# Patient Record
Sex: Male | Born: 1995 | Race: Black or African American | Hispanic: No | Marital: Single | Smoking: Never smoker
Health system: Southern US, Community
[De-identification: ages and names within clinical notes are randomized; demographics above are authoritative.]

---
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
## 2015-07-18 ENCOUNTER — Ambulatory Visit: Payer: BLUE CROSS/BLUE SHIELD | Admitting: Family Medicine

## 2015-09-06 ENCOUNTER — Ambulatory Visit (INDEPENDENT_AMBULATORY_CARE_PROVIDER_SITE_OTHER): Payer: BLUE CROSS/BLUE SHIELD | Admitting: Family Medicine

## 2015-09-06 ENCOUNTER — Encounter: Payer: Self-pay | Admitting: Family Medicine

## 2015-09-06 VITALS — BP 110/72 | HR 69 | Temp 99.0°F | Ht 68.5 in | Wt 164.0 lb

## 2015-09-06 DIAGNOSIS — R05 Cough: Secondary | ICD-10-CM | POA: Diagnosis not present

## 2015-09-06 DIAGNOSIS — R059 Cough, unspecified: Secondary | ICD-10-CM

## 2015-09-06 MED ORDER — FLUTICASONE PROPIONATE 50 MCG/ACT NA SUSP: 2.0000 | Freq: Every day | NASAL | 6 refills | Status: DC

## 2015-09-06 MED ORDER — TRIAMCINOLONE ACETONIDE 0.1 % EX CREA: 1.0000 "application " | TOPICAL_CREAM | Freq: Two times a day (BID) | CUTANEOUS | 0 refills | Status: DC

## 2015-09-06 NOTE — Patient Instructions (Addendum)

## 2015-09-06 NOTE — Progress Notes (Signed)
BP 110/72 (BP Location: Left Arm, Patient Position: Sitting, Cuff Size: Normal)   Pulse 69   Temp 99 F (37.2 C)   Ht 5' 8.5" (1.74 m)   Wt 164 lb (74.4 kg)   SpO2 98%   BMI 24.57 kg/m    Subjective:    Patient ID: Colton Simmons, male    DOB: 02/28/1995, 20 y.o.   MRN: [id number]  HPI: Colton Simmons is a 20 y.o. male  Chief Complaint  Patient presents with  . Cough   COUGH Duration: 3 months Circumstances of initial development of cough: unknown Cough severity: moderate Cough description: productive and hacking Aggravating factors:  worse in the AM and with exercise Alleviating factors: nothing Status:  stable Treatments attempted: cold/sinus, mucinex and cough syrup Wheezing: no Shortness of breath: no Chest pain: no Chest tightness:no Nasal congestion: yes Runny nose: no Postnasal drip: yes Frequent throat clearing or swallowing: yes Hemoptysis: no Fevers: no Night sweats: no Weight loss: no Heartburn: no Recent foreign travel: no Tuberculosis contacts: no  Relevant past medical, surgical, family and social history reviewed and updated as indicated. Interim medical history since our last visit reviewed. Allergies and medications reviewed and updated.  Review of Systems  Constitutional: Negative.   HENT: Positive for congestion, postnasal drip and rhinorrhea. Negative for dental problem, drooling, ear discharge, ear pain, facial swelling, hearing loss, mouth sores, nosebleeds, sinus pressure, sneezing, sore throat, trouble swallowing and voice change.   Respiratory: Positive for cough. Negative for apnea, choking, chest tightness, shortness of breath, wheezing and stridor.   Cardiovascular: Negative.   Psychiatric/Behavioral: Negative.     Per HPI unless specifically indicated above     Objective:    BP 110/72 (BP Location: Left Arm, Patient Position: Sitting, Cuff Size: Normal)   Pulse 69   Temp 99 F (37.2 C)   Ht 5' 8.5" (1.74 m)   Wt 164 lb  (74.4 kg)   SpO2 98%   BMI 24.57 kg/m   Wt Readings from Last 3 Encounters:  09/06/15 164 lb (74.4 kg)  10/19/14 166 lb (75.3 kg) (68 %, Z= 0.46)*  01/26/14 161 lb (73 kg) (65 %, Z= 0.38)*   * Growth percentiles are based on CDC 2-20 Years data.    Physical Exam  Constitutional: He is oriented to person, place, and time. He appears well-developed and well-nourished. No distress.  HENT:  Head: Normocephalic and atraumatic.  Right Ear: Hearing and external ear normal.  Left Ear: Hearing and external ear normal.  Nose: Nose normal.  Mouth/Throat: Oropharynx is clear and moist. No oropharyngeal exudate.  Significant post-nasal drip  Eyes: Conjunctivae, EOM and lids are normal. Pupils are equal, round, and reactive to light. Right eye exhibits no discharge. Left eye exhibits no discharge. No scleral icterus.  Neck: Normal range of motion. Neck supple. No JVD present. No tracheal deviation present. No thyromegaly present.  Cardiovascular: Normal rate, regular rhythm, normal heart sounds and intact distal pulses.  Exam reveals no gallop and no friction rub.   No murmur heard. Pulmonary/Chest: Effort normal and breath sounds normal. No stridor. No respiratory distress. He has no wheezes. He has no rales. He exhibits no tenderness.  Musculoskeletal: Normal range of motion.  Lymphadenopathy:    He has no cervical adenopathy.  Neurological: He is alert and oriented to person, place, and time.  Skin: Skin is warm, dry and intact. No rash noted. He is not diaphoretic. No erythema. No pallor.  Psychiatric: He has  a normal mood and affect. His speech is normal and behavior is normal. Judgment and thought content normal. Cognition and memory are normal.  Nursing note and vitals reviewed.   Results for orders placed or performed in visit on 11/03/14  CBC with Differential/Platelet  Result Value Ref Range   WBC 2.9 (L) 3.4 - 10.8 x10E3/uL   RBC 5.41 4.14 - 5.80 x10E6/uL   Hemoglobin 14.7 12.6 -  17.7 g/dL   Hematocrit 16.143.9 09.637.5 - 51.0 %   MCV 81 79 - 97 fL   MCH 27.2 26.6 - 33.0 pg   MCHC 33.5 31.5 - 35.7 g/dL   RDW 04.511.9 (L) 40.912.3 - 81.115.4 %   Platelets 215 150 - 379 x10E3/uL   Neutrophils 40 %   Lymphs 44 %   Monocytes 12 %   Eos 4 %   Basos 0 %   Neutrophils Absolute 1.2 (L) 1.4 - 7.0 x10E3/uL   Lymphocytes Absolute 1.2 0.7 - 3.1 x10E3/uL   Monocytes Absolute 0.3 0.1 - 0.9 x10E3/uL   EOS (ABSOLUTE) 0.1 0.0 - 0.4 x10E3/uL   Basophils Absolute 0.0 0.0 - 0.2 x10E3/uL   Immature Granulocytes 0 %   Immature Grans (Abs) 0.0 0.0 - 0.1 x10E3/uL      Assessment & Plan:   Problem List Items Addressed This Visit    None    Visit Diagnoses    Cough    -  Primary   Normal spiro. Appears to be due to post nasal drip. Start flonase and check back in in about a month for physical. Call with any concerns.    Relevant Orders   Spirometry with Graph (Completed)       Follow up plan: Return after 9/27 for physical.

## 2015-09-14 ENCOUNTER — Encounter (INDEPENDENT_AMBULATORY_CARE_PROVIDER_SITE_OTHER): Payer: Self-pay

## 2015-10-21 ENCOUNTER — Encounter: Payer: Self-pay | Admitting: Family Medicine

## 2015-10-21 ENCOUNTER — Ambulatory Visit (INDEPENDENT_AMBULATORY_CARE_PROVIDER_SITE_OTHER): Payer: BLUE CROSS/BLUE SHIELD | Admitting: Family Medicine

## 2015-10-21 VITALS — BP 135/79 | HR 61 | Temp 97.6°F | Ht 69.2 in | Wt 164.3 lb

## 2015-10-21 DIAGNOSIS — Z113 Encounter for screening for infections with a predominantly sexual mode of transmission: Secondary | ICD-10-CM | POA: Diagnosis not present

## 2015-10-21 DIAGNOSIS — F329 Major depressive disorder, single episode, unspecified: Secondary | ICD-10-CM

## 2015-10-21 DIAGNOSIS — F32A Depression, unspecified: Secondary | ICD-10-CM

## 2015-10-21 DIAGNOSIS — Z0000 Encounter for general adult medical examination without abnormal findings: Secondary | ICD-10-CM

## 2015-10-21 DIAGNOSIS — D72819 Decreased white blood cell count, unspecified: Secondary | ICD-10-CM

## 2015-10-21 LAB — UA/M W/RFLX CULTURE, ROUTINE
Bilirubin, UA: NEGATIVE
Glucose, UA: NEGATIVE
Ketones, UA: NEGATIVE
Leukocytes, UA: NEGATIVE
NITRITE UA: NEGATIVE
PH UA: 6 (ref 5.0–7.5)
RBC, UA: NEGATIVE
Specific Gravity, UA: >1.03 — ABNORMAL HIGH (ref 1.005–1.030)
Urobilinogen, Ur: 0.2 mg/dL (ref 0.2–1.0)

## 2015-10-21 LAB — MICROSCOPIC EXAMINATION

## 2015-10-21 NOTE — Progress Notes (Signed)
BP 135/79 (BP Location: Left Arm, Patient Position: Sitting, Cuff Size: Normal)   Pulse 61   Temp 97.6 F (36.4 C)   Ht 5' 9.2" (1.758 m)   Wt 164 lb 4.8 oz (74.5 kg)   SpO2 98%   BMI 24.12 kg/m    Subjective:    Patient ID: MARKEVION LATTIN, male    DOB: 09/08/1995, 20 y.o.   MRN: [id number]  HPI: THANIEL COLUCCIO is a 20 y.o. male presenting on 10/21/2015 for comprehensive medical examination. Current medical complaints include: Loss of appetite  He currently lives with: parents Interim Problems from his last visit: no  Depression Screen done today and results listed below:  Depression screen [facility] 2/9 10/21/2015  Decreased Interest 1  Down, Depressed, Hopeless 2  PHQ - 2 Score 3  Altered sleeping 2  Tired, decreased energy 3  Change in appetite 1  Feeling bad or failure about yourself  3  Trouble concentrating 1  Moving slowly or fidgety/restless 1  Suicidal thoughts 1  PHQ-9 Score 15   GAD 7 : Generalized Anxiety Score 10/21/2015  Nervous, Anxious, on Edge 2  Control/stop worrying 3  Worry too much - different things 3  Trouble relaxing 1  Restless 1  Easily annoyed or irritable 3  Afraid - awful might happen 1  Total GAD 7 Score 14  Anxiety Difficulty Somewhat difficult    Past Medical History:  History reviewed. No pertinent past medical history.  Surgical History:  History reviewed. No pertinent surgical history.  Medications:  No current outpatient prescriptions on file prior to visit.   No current facility-administered medications on file prior to visit.     Allergies:  No Known Allergies  Social History:  Social History   Social History  . Marital status: Single    Spouse name: N/A  . Number of children: N/A  . Years of education: N/A   Occupational History  . Not on file.   Social History Main Topics  . Smoking status: Never Smoker  . Smokeless tobacco: Never Used  . Alcohol use No  . Drug use: No  . Sexual activity: No   Other Topics  Concern  . Not on file   Social History Narrative  . No narrative on file   History  Smoking Status  . Never Smoker  Smokeless Tobacco  . Never Used   History  Alcohol Use No    Family History:  Family History  Problem Relation Age of Onset  . Menstrual problems Sister     Past medical history, surgical history, medications, allergies, family history and social history reviewed with patient today and changes made to appropriate areas of the chart.   Review of Systems  Constitutional: Negative.   HENT: Negative.   Eyes: Positive for blurred vision (at night). Negative for double vision, photophobia, pain, discharge and redness.  Respiratory: Negative.   Cardiovascular: Negative.   Gastrointestinal: Negative.        Loss of appetite- last few weeks  Genitourinary: Negative.   Musculoskeletal: Negative.   Skin: Negative.   Neurological: Negative.   Endo/Heme/Allergies: Negative.   Psychiatric/Behavioral: Positive for depression. Negative for hallucinations, memory loss, substance abuse and suicidal ideas. The patient is nervous/anxious. The patient does not have insomnia.     All other ROS negative except what is listed above and in the HPI.      Objective:    BP 135/79 (BP Location: Left Arm, Patient Position: Sitting, Cuff Size:  Normal)   Pulse 61   Temp 97.6 F (36.4 C)   Ht 5' 9.2" (1.758 m)   Wt 164 lb 4.8 oz (74.5 kg)   SpO2 98%   BMI 24.12 kg/m   Wt Readings from Last 3 Encounters:  10/21/15 164 lb 4.8 oz (74.5 kg)  09/06/15 164 lb (74.4 kg)  10/19/14 166 lb (75.3 kg) (68 %, Z= 0.46)*   * Growth percentiles are based on CDC 2-20 Years data.    Physical Exam  Constitutional: He is oriented to person, place, and time. He appears well-developed and well-nourished. No distress.  HENT:  Head: Normocephalic and atraumatic.  Right Ear: Hearing, tympanic membrane, external ear and ear canal normal.  Left Ear: Hearing, tympanic membrane, external ear and  ear canal normal.  Nose: Nose normal.  Mouth/Throat: Uvula is midline, oropharynx is clear and moist and mucous membranes are normal. No oropharyngeal exudate.  Eyes: Conjunctivae, EOM and lids are normal. Pupils are equal, round, and reactive to light. Right eye exhibits no discharge. Left eye exhibits no discharge. No scleral icterus.  Neck: Normal range of motion. Neck supple. No JVD present. No tracheal deviation present. No thyromegaly present.  Cardiovascular: Normal rate, regular rhythm, normal heart sounds and intact distal pulses.  Exam reveals no gallop and no friction rub.   No murmur heard. Pulmonary/Chest: Effort normal and breath sounds normal. No stridor. No respiratory distress. He has no wheezes. He has no rales. He exhibits no tenderness.  Abdominal: Soft. Bowel sounds are normal. He exhibits no distension and no mass. There is no tenderness. There is no rebound and no guarding.  Genitourinary: Penis normal. No penile tenderness.  Musculoskeletal: Normal range of motion. He exhibits no edema, tenderness or deformity.  Lymphadenopathy:    He has no cervical adenopathy.  Neurological: He is alert and oriented to person, place, and time. He has normal reflexes. He displays normal reflexes. No cranial nerve deficit. He exhibits normal muscle tone. Coordination normal.  Skin: Skin is warm, dry and intact. No rash noted. He is not diaphoretic. No erythema. No pallor.  Psychiatric: His speech is normal and behavior is normal. Judgment and thought content normal. His mood appears anxious. Cognition and memory are normal. He exhibits a depressed mood.  Nursing note and vitals reviewed.   Results for orders placed or performed in visit on 11/03/14  CBC with Differential/Platelet  Result Value Ref Range   WBC 2.9 (L) 3.4 - 10.8 x10E3/uL   RBC 5.41 4.14 - 5.80 x10E6/uL   Hemoglobin 14.7 12.6 - 17.7 g/dL   Hematocrit 16.143.9 09.637.5 - 51.0 %   MCV 81 79 - 97 fL   MCH 27.2 26.6 - 33.0 pg    MCHC 33.5 31.5 - 35.7 g/dL   RDW 04.511.9 (L) 40.912.3 - 81.115.4 %   Platelets 215 150 - 379 x10E3/uL   Neutrophils 40 %   Lymphs 44 %   Monocytes 12 %   Eos 4 %   Basos 0 %   Neutrophils Absolute 1.2 (L) 1.4 - 7.0 x10E3/uL   Lymphocytes Absolute 1.2 0.7 - 3.1 x10E3/uL   Monocytes Absolute 0.3 0.1 - 0.9 x10E3/uL   EOS (ABSOLUTE) 0.1 0.0 - 0.4 x10E3/uL   Basophils Absolute 0.0 0.0 - 0.2 x10E3/uL   Immature Granulocytes 0 %   Immature Grans (Abs) 0.0 0.0 - 0.1 x10E3/uL      Assessment & Plan:   Problem List Items Addressed This Visit      Other  Leukopenia    Rechecking levels today.        Other Visit Diagnoses    Routine general medical examination at a health care facility    -  Primary   Up to date on vaccines. Screening labs checked today. Continue diet and exercise.    Relevant Orders   CBC with Differential/Platelet   Comprehensive metabolic panel   Lipid Panel w/o Chol/HDL Ratio   TSH   UA/M w/rflx Culture, Routine   Routine screening for STI (sexually transmitted infection)       Labs checked today, await results.    Relevant Orders   HIV antibody   Hepatitis, Acute   RPR   GC/Chlamydia Probe Amp   HSV(herpes simplex vrs) 1+2 ab-IgG   Immunization due       Flu shot given today.   Relevant Orders   Flu Vaccine QUAD 36+ mos PF IM (Fluarix & Fluzone Quad PF)   Depression       Would like to monitor. Does not want medicine at this time. Recheck 2 months to see how he's doing.        LABORATORY TESTING:  Health maintenance labs ordered today as discussed above.   IMMUNIZATIONS:   - Tdap: Tetanus vaccination status reviewed: last tetanus booster within 10 years. - Influenza: Administered today - Pneumovax: Not applicable   PATIENT COUNSELING:    Sexuality: Discussed sexually transmitted diseases, partner selection, use of condoms, avoidance of unintended pregnancy  and contraceptive alternatives.   Advised to avoid cigarette smoking.  I discussed with the  patient that most people either abstain from alcohol or drink within safe limits (<=14/week and <=4 drinks/occasion for males, <=7/weeks and <= 3 drinks/occasion for females) and that the risk for alcohol disorders and other health effects rises proportionally with the number of drinks per week and how often a drinker exceeds daily limits.  Discussed cessation/primary prevention of drug use and availability of treatment for abuse.   Diet: Encouraged to adjust caloric intake to maintain  or achieve ideal body weight, to reduce intake of dietary saturated fat and total fat, to limit sodium intake by avoiding high sodium foods and not adding table salt, and to maintain adequate dietary potassium and calcium preferably from fresh fruits, vegetables, and low-fat dairy products.    stressed the importance of regular exercise  Injury prevention: Discussed safety belts, safety helmets, smoke detector, smoking near bedding or upholstery.   Dental health: Discussed importance of regular tooth brushing, flossing, and dental visits.   Follow up plan: NEXT PREVENTATIVE PHYSICAL DUE IN 1 YEAR. No Follow-up on file.

## 2015-10-21 NOTE — Assessment & Plan Note (Signed)
Rechecking levels today. 

## 2015-10-22 LAB — LIPID PANEL W/O CHOL/HDL RATIO
Cholesterol, Total: 138 mg/dL (ref 100–199)
HDL: 47 mg/dL (ref >39)
LDL CALC: 74 mg/dL (ref 0–99)
Triglycerides: 83 mg/dL (ref 0–149)
VLDL CHOLESTEROL CAL: 17 mg/dL (ref 5–40)

## 2015-10-22 LAB — COMPREHENSIVE METABOLIC PANEL
A/G RATIO: 1.9 (ref 1.2–2.2)
ALT: 14 IU/L (ref 0–44)
AST: 14 IU/L (ref 0–40)
Albumin: 4.6 g/dL (ref 3.5–5.5)
Alkaline Phosphatase: 86 IU/L (ref 39–117)
BUN/Creatinine Ratio: 8 — ABNORMAL LOW (ref 9–20)
BUN: 8 mg/dL (ref 6–20)
Bilirubin Total: 1.7 mg/dL — ABNORMAL HIGH (ref 0.0–1.2)
CALCIUM: 9.3 mg/dL (ref 8.7–10.2)
CO2: 24 mmol/L (ref 18–29)
Chloride: 102 mmol/L (ref 96–106)
Creatinine, Ser: 0.97 mg/dL (ref 0.76–1.27)
GFR, EST AFRICAN AMERICAN: 129 mL/min/{1.73_m2} (ref >59)
GFR, EST NON AFRICAN AMERICAN: 112 mL/min/{1.73_m2} (ref >59)
Globulin, Total: 2.4 g/dL (ref 1.5–4.5)
Glucose: 98 mg/dL (ref 65–99)
POTASSIUM: 3.9 mmol/L (ref 3.5–5.2)
Sodium: 142 mmol/L (ref 134–144)
TOTAL PROTEIN: 7 g/dL (ref 6.0–8.5)

## 2015-10-22 LAB — HIV ANTIBODY (ROUTINE TESTING W REFLEX): HIV SCREEN 4TH GENERATION: NONREACTIVE

## 2015-10-22 LAB — CBC WITH DIFFERENTIAL/PLATELET
BASOS ABS: 0 10*3/uL (ref 0.0–0.2)
BASOS: 0 %
EOS (ABSOLUTE): 0.1 10*3/uL (ref 0.0–0.4)
Eos: 2 %
HEMATOCRIT: 43.8 % (ref 37.5–51.0)
Hemoglobin: 14.6 g/dL (ref 12.6–17.7)
IMMATURE GRANS (ABS): 0 10*3/uL (ref 0.0–0.1)
Immature Granulocytes: 0 %
LYMPHS ABS: 1.5 10*3/uL (ref 0.7–3.1)
Lymphs: 46 %
MCH: 26.9 pg (ref 26.6–33.0)
MCHC: 33.3 g/dL (ref 31.5–35.7)
MCV: 81 fL (ref 79–97)
MONOS ABS: 0.4 10*3/uL (ref 0.1–0.9)
Monocytes: 12 %
Neutrophils Absolute: 1.3 10*3/uL — ABNORMAL LOW (ref 1.4–7.0)
Neutrophils: 40 %
PLATELETS: 204 10*3/uL (ref 150–379)
RBC: 5.42 x10E6/uL (ref 4.14–5.80)
RDW: 12 % — ABNORMAL LOW (ref 12.3–15.4)
WBC: 3.3 10*3/uL — ABNORMAL LOW (ref 3.4–10.8)

## 2015-10-22 LAB — HEPATITIS PANEL, ACUTE
HEP B C IGM: NEGATIVE
Hep A IgM: NEGATIVE
Hep C Virus Ab: <0.1 s/co ratio (ref 0.0–0.9)
Hepatitis B Surface Ag: NEGATIVE

## 2015-10-22 LAB — RPR: RPR Ser Ql: NONREACTIVE

## 2015-10-22 LAB — HSV(HERPES SIMPLEX VRS) I + II AB-IGG: HSV 2 Glycoprotein G Ab, IgG: <0.91 index (ref 0.00–0.90)

## 2015-10-22 LAB — TSH: TSH: 0.976 u[IU]/mL (ref 0.450–4.500)

## 2015-10-24 ENCOUNTER — Telehealth: Payer: Self-pay | Admitting: Family Medicine

## 2015-10-24 LAB — GC/CHLAMYDIA PROBE AMP
Chlamydia trachomatis, NAA: NEGATIVE
NEISSERIA GONORRHOEAE BY PCR: NEGATIVE

## 2015-10-24 NOTE — Telephone Encounter (Signed)
Please let Onalee HuaDavid know that all his labs came back negative. Thanks!

## 2015-10-24 NOTE — Telephone Encounter (Signed)
Patient notified

## 2016-07-04 ENCOUNTER — Ambulatory Visit (INDEPENDENT_AMBULATORY_CARE_PROVIDER_SITE_OTHER): Payer: BLUE CROSS/BLUE SHIELD | Admitting: Family Medicine

## 2016-07-04 ENCOUNTER — Encounter: Payer: Self-pay | Admitting: Family Medicine

## 2016-07-04 VITALS — BP 123/76 | HR 64 | Temp 99.0°F | Ht 71.0 in | Wt 159.0 lb

## 2016-07-04 DIAGNOSIS — R042 Hemoptysis: Secondary | ICD-10-CM | POA: Diagnosis not present

## 2016-07-04 NOTE — Progress Notes (Signed)
   BP 123/76   Pulse 64   Temp 99 F (37.2 C)   Ht 5\' 11"  (1.803 m)   Wt 159 lb (72.1 kg)   SpO2 100%   BMI 22.18 kg/m    Subjective:    Patient ID: Colton KaufmannDavid J Simmons, male    DOB: 09/18/1995, 21 y.o.   MRN: [id number]  HPI: Colton KaufmannDavid J Simmons is a 21 y.o. male  Chief Complaint  Patient presents with  . Coughing Up Blood    happened earlier this week,  was spitting up bright red blood.  has now resolved. No other symptoms, no cough, no abdominal pain, no chest pain, no NVD.    Patient presents with an episode of hemoptysis toward the end of a 2 week URI. States it was a small amount of bright red blood on sputum several times after coughing fits. Has not happened since several days ago, and URI sxs have completely resolved. No fever, chills, SOB, wheezing, CP. Pt is a non-smoker, takes no medications, and no known medical issues.   Relevant past medical, surgical, family and social history reviewed and updated as indicated. Interim medical history since our last visit reviewed. Allergies and medications reviewed and updated.  Review of Systems  Constitutional: Negative.   HENT: Positive for congestion.   Eyes: Negative.   Respiratory: Positive for cough.        Hemoptysis  Cardiovascular: Negative.   Gastrointestinal: Negative.   Genitourinary: Negative.   Musculoskeletal: Negative.   Neurological: Negative.   Psychiatric/Behavioral: Negative.     Per HPI unless specifically indicated above     Objective:    BP 123/76   Pulse 64   Temp 99 F (37.2 C)   Ht 5\' 11"  (1.803 m)   Wt 159 lb (72.1 kg)   SpO2 100%   BMI 22.18 kg/m   Wt Readings from Last 3 Encounters:  07/04/16 159 lb (72.1 kg)  10/21/15 164 lb 4.8 oz (74.5 kg)  09/06/15 164 lb (74.4 kg)    Physical Exam  Constitutional: He is oriented to person, place, and time. He appears well-developed and well-nourished. No distress.  HENT:  Head: Atraumatic.  Right Ear: External ear normal.  Left Ear: External ear  normal.  Nose: Nose normal.  Mouth/Throat: Oropharynx is clear and moist. No oropharyngeal exudate.  Eyes: Conjunctivae are normal. Pupils are equal, round, and reactive to light. No scleral icterus.  Neck: Normal range of motion. Neck supple.  Cardiovascular: Normal rate and normal heart sounds.   Pulmonary/Chest: Effort normal and breath sounds normal. No respiratory distress.  Musculoskeletal: Normal range of motion.  Lymphadenopathy:    He has no cervical adenopathy.  Neurological: He is alert and oriented to person, place, and time.  Skin: Skin is warm and dry.  Psychiatric: He has a normal mood and affect. His behavior is normal.  Nursing note and vitals reviewed.     Assessment & Plan:   Problem List Items Addressed This Visit    None    Visit Diagnoses    Hemoptysis    -  Primary   Suspect d/t bronchial irritation from URI and persistent coughing. Check CBC, continue to monitor. URI has since resolved and no further bleeding episodes.    Relevant Orders   CBC with Differential/Platelet       Follow up plan: Return if symptoms worsen or fail to improve.

## 2016-07-05 ENCOUNTER — Telehealth: Payer: Self-pay | Admitting: Family Medicine

## 2016-07-05 DIAGNOSIS — D72819 Decreased white blood cell count, unspecified: Secondary | ICD-10-CM

## 2016-07-05 LAB — CBC WITH DIFFERENTIAL/PLATELET
BASOS ABS: 0 10*3/uL (ref 0.0–0.2)
Basos: 0 %
EOS (ABSOLUTE): 0 10*3/uL (ref 0.0–0.4)
Eos: 1 %
HEMOGLOBIN: 14.2 g/dL (ref 13.0–17.7)
Hematocrit: 43.6 % (ref 37.5–51.0)
IMMATURE GRANS (ABS): 0 10*3/uL (ref 0.0–0.1)
Immature Granulocytes: 0 %
LYMPHS: 41 %
Lymphocytes Absolute: 1.3 10*3/uL (ref 0.7–3.1)
MCH: 27.2 pg (ref 26.6–33.0)
MCHC: 32.6 g/dL (ref 31.5–35.7)
MCV: 84 fL (ref 79–97)
MONOCYTES: 13 %
Monocytes Absolute: 0.4 10*3/uL (ref 0.1–0.9)
NEUTROS ABS: 1.5 10*3/uL (ref 1.4–7.0)
Neutrophils: 45 %
Platelets: 257 10*3/uL (ref 150–379)
RBC: 5.22 x10E6/uL (ref 4.14–5.80)
RDW: 12.7 % (ref 12.3–15.4)
WBC: 3.3 10*3/uL — ABNORMAL LOW (ref 3.4–10.8)

## 2016-07-05 NOTE — Telephone Encounter (Signed)
Left message to call.

## 2016-07-05 NOTE — Telephone Encounter (Signed)
Please call and let him know his blood counts didn't indicate any bleeding issues that would have made him cough up blood - everything looked good other than his white blood cells are still remaining just a touch low so we are going to get him over to Hematology for further workup to see what's going on. Referral placed.

## 2016-07-06 NOTE — Telephone Encounter (Signed)
Patient notified of results and was given the appt for Hematology.

## 2016-07-06 NOTE — Telephone Encounter (Signed)
Patient returned Amy's call.  He can be reached at [phone]  Thanks

## 2016-07-24 ENCOUNTER — Inpatient Hospital Stay: Payer: BLUE CROSS/BLUE SHIELD

## 2016-07-24 ENCOUNTER — Inpatient Hospital Stay: Payer: BLUE CROSS/BLUE SHIELD | Attending: Oncology | Admitting: Oncology

## 2016-07-24 ENCOUNTER — Encounter: Payer: Self-pay | Admitting: Oncology

## 2016-07-24 VITALS — BP 116/67 | HR 64 | Temp 97.0°F | Resp 18 | Ht 70.0 in | Wt 148.5 lb

## 2016-07-24 DIAGNOSIS — D709 Neutropenia, unspecified: Secondary | ICD-10-CM | POA: Diagnosis present

## 2016-07-24 DIAGNOSIS — Z809 Family history of malignant neoplasm, unspecified: Secondary | ICD-10-CM

## 2016-07-24 LAB — CBC WITH DIFFERENTIAL/PLATELET
Basophils Absolute: 0.1 10*3/uL (ref 0–0.1)
Basophils Relative: 4 %
Eosinophils Absolute: 0 10*3/uL (ref 0–0.7)
Eosinophils Relative: 1 %
HEMATOCRIT: 45.1 % (ref 40.0–52.0)
HEMOGLOBIN: 15 g/dL (ref 13.0–18.0)
LYMPHS ABS: 1.3 10*3/uL (ref 1.0–3.6)
LYMPHS PCT: 40 %
MCH: 27.3 pg (ref 26.0–34.0)
MCHC: 33.2 g/dL (ref 32.0–36.0)
MCV: 82.2 fL (ref 80.0–100.0)
MONOS PCT: 8 %
Monocytes Absolute: 0.3 10*3/uL (ref 0.2–1.0)
NEUTROS ABS: 1.5 10*3/uL (ref 1.4–6.5)
NEUTROS PCT: 47 %
Platelets: 204 10*3/uL (ref 150–440)
RBC: 5.49 MIL/uL (ref 4.40–5.90)
RDW: 12.6 % (ref 11.5–14.5)
WBC: 3.2 10*3/uL — AB (ref 3.8–10.6)

## 2016-07-24 LAB — FOLATE: FOLATE: 29 ng/mL (ref >5.9)

## 2016-07-24 LAB — VITAMIN B12: VITAMIN B 12: 746 pg/mL (ref 180–914)

## 2016-07-24 NOTE — Progress Notes (Signed)
Patient here today as new evaluation regarding leukopenia.  Referred by Dr. Maurice MarchLane.

## 2016-07-25 ENCOUNTER — Encounter: Payer: Self-pay | Admitting: Oncology

## 2016-07-25 NOTE — Progress Notes (Signed)
Hematology/Oncology Consult note [facility] Telephone:([phone] [phone]) [phone]  Patient Care Team: Valerie Roys, DO as PCP - General (Family Medicine)   Name of the patient: Colton Simmons  [id number]  11-Sep-1995    Reason for referral- leukopenia   Referring physician- Dr. Park Liter  Date of visit: 07/25/16   History of presenting illness- Patient is a 21 yr old african [place] male who has been referred for leukopenia/ neutropenia. Most recent cbc from 07/04/16 showed wbc of 3.3, H/H of 14./43.6 and platelet count of 257. difefrential showed mild neutropenia with anc of 1.5. Reviewing his prior cbc, patient has had waxing and waning leukopenia/ neutropenia and his anc is between 1.1-1.5. No family h/o blood disorders. He has not had any recurrent infections growing up other than occasional ear infections. He feels well and denies any fever, unintentional weight loss or night sweats. Denies any use of medications, OTC meds or herbal medications. Recent HIV, Hepatitis testing negative  ECOG PS- 0  Pain scale- 0   Review of systems- Review of Systems  Constitutional: Negative for chills, fever, malaise/fatigue and weight loss.  HENT: Negative for congestion, ear discharge and nosebleeds.   Eyes: Negative for blurred vision.  Respiratory: Negative for cough, hemoptysis, sputum production, shortness of breath and wheezing.   Cardiovascular: Negative for chest pain, palpitations, orthopnea and claudication.  Gastrointestinal: Negative for abdominal pain, blood in stool, constipation, diarrhea, heartburn, melena, nausea and vomiting.  Genitourinary: Negative for dysuria, flank pain, frequency, hematuria and urgency.  Musculoskeletal: Negative for back pain, joint pain and myalgias.  Skin: Negative for rash.  Neurological: Negative for dizziness, tingling, focal weakness, seizures, weakness and headaches.  Endo/Heme/Allergies: Does not bruise/bleed  easily.  Psychiatric/Behavioral: Negative for depression and suicidal ideas. The patient does not have insomnia.     No Known Allergies  Patient Active Problem List   Diagnosis Date Noted  . Leukopenia 11/05/2014     No past medical history on file.   Past Surgical History:  Procedure Laterality Date  . PROSTATE SURGERY  2014    Social History   Social History  . Marital status: Single    Spouse name: N/A  . Number of children: N/A  . Years of education: N/A   Occupational History  . Not on file.   Social History Main Topics  . Smoking status: Never Smoker  . Smokeless tobacco: Never Used  . Alcohol use Yes     Comment: socially  . Drug use: No  . Sexual activity: No   Other Topics Concern  . Not on file   Social History Narrative  . No narrative on file     Family History  Problem Relation Age of Onset  . Cancer Father   . Menstrual problems Sister   . Cancer Paternal Aunt     No current outpatient prescriptions on file.   Physical exam:  Vitals:   07/24/16 1520  BP: 116/67  Pulse: 64  Resp: 18  Temp: (!) 97 F (36.1 C)  TempSrc: Tympanic  Weight: 148 lb 8 oz (67.4 kg)  Height: _0  (1.778 m)   Physical Exam  Constitutional: He is oriented to person, place, and time and well-developed, well-nourished, and in no distress.  HENT:  Head: Normocephalic and atraumatic.  Eyes: EOM are normal. Pupils are equal, round, and reactive to light.  Neck: Normal range of motion.  Cardiovascular: Normal rate, regular rhythm and normal heart sounds.   Pulmonary/Chest: Effort normal  and breath sounds normal.  Abdominal: Soft. Bowel sounds are normal.  No palpable splenomegaly  Lymphadenopathy:  No palpable cervical, supraclavicular, axillary or inguinal  Neurological: He is alert and oriented to person, place, and time.  Skin: Skin is warm and dry.       CMP Latest Ref Rng & Units 10/21/2015  Glucose 65 - 99 mg/dL 98  BUN 6 - 20 mg/dL 8    Creatinine 0.76 - 1.27 mg/dL 0.97  Sodium 134 - 144 mmol/L 142  Potassium 3.5 - 5.2 mmol/L 3.9  Chloride 96 - 106 mmol/L 102  CO2 18 - 29 mmol/L 24  Calcium 8.7 - 10.2 mg/dL 9.3  Total Protein 6.0 - 8.5 g/dL 7.0  Total Bilirubin 0.0 - 1.2 mg/dL 1.7(H)  Alkaline Phos 39 - 117 IU/L 86  AST 0 - 40 IU/L 14  ALT 0 - 44 IU/L 14   CBC Latest Ref Rng & Units 07/24/2016  WBC 3.8 - 10.6 K/uL 3.2(L)  Hemoglobin 13.0 - 18.0 g/dL 15.0  Hematocrit 40.0 - 52.0 % 45.1  Platelets 150 - 440 K/uL 204   Assessment and plan- Patient is a 21 y.o. male referred for leukopenia/ neutropenia  Patient has had long standing leukopenia/neutropenia atleast dating back to 2016. He has isolated neutropenia in the absence of other cytopenias. No evidence of adenopathy, B symptoms or recurrent infections. Recent HIV and hepatitis testing negative. 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 I will check repeat cbc with diff, pathology review of smear, B12 and folate. I will see him back in 2 weeks time to discuss the results and further management   Thank you for this kind referral and the opportunity to participate in the care of this patient   Visit Diagnosis 1. Leukopenia, unspecified type     Dr. Randa Evens, MD, MPH [facility] Pager- [phone] 07/25/2016  6:54 PM

## 2016-07-26 LAB — PATHOLOGIST SMEAR REVIEW

## 2016-08-09 ENCOUNTER — Inpatient Hospital Stay: Payer: BLUE CROSS/BLUE SHIELD | Admitting: Oncology

## 2016-08-23 ENCOUNTER — Telehealth: Payer: Self-pay | Admitting: *Deleted

## 2016-08-23 ENCOUNTER — Inpatient Hospital Stay: Payer: BLUE CROSS/BLUE SHIELD | Admitting: Oncology

## 2016-08-23 NOTE — Telephone Encounter (Signed)
Called pt number that he left and got his voicemail and left message with his lab values and everything was normal except the wbc  At 3.2, explained that dr. Smith Robertao felt this was abnormal due to his ethnicity.  I asked him that if he had questions he could call me. Left my direct number and we were sending his an appt in 6 months from now.

## 2016-08-23 NOTE — Telephone Encounter (Signed)
-----  Message from Sindy Guadeloupe, MD sent at 08/23/2016 11:26 AM EDT ----- Regarding: FW: results Contact: [phone] Can you please call him and tell him cbc, b12, folate results. Mild neutropenia likely due to ethnicity. No need for bone marrow biopsy. I will see him back in 6 months with cbc with diff  Verdis Frederickson- can you schedule appointment?  Thanks, Astrid Divine ----- Message ----- From: Elouise Munroe Sent: 08/23/2016  11:09 AM To: Luella Cook, RN, Manus Rudd, RN, # Subject: results                                        PT couldn't make appt today @ 11-he requested results of Lab via phone?  THX

## 2016-10-22 ENCOUNTER — Ambulatory Visit (INDEPENDENT_AMBULATORY_CARE_PROVIDER_SITE_OTHER): Payer: BLUE CROSS/BLUE SHIELD | Admitting: Family Medicine

## 2016-10-22 ENCOUNTER — Encounter: Payer: Self-pay | Admitting: Family Medicine

## 2016-10-22 VITALS — BP 118/74 | HR 53 | Temp 97.2°F | Ht 69.4 in | Wt 156.2 lb

## 2016-10-22 DIAGNOSIS — Z113 Encounter for screening for infections with a predominantly sexual mode of transmission: Secondary | ICD-10-CM | POA: Diagnosis not present

## 2016-10-22 DIAGNOSIS — G4700 Insomnia, unspecified: Secondary | ICD-10-CM | POA: Diagnosis not present

## 2016-10-22 DIAGNOSIS — F329 Major depressive disorder, single episode, unspecified: Secondary | ICD-10-CM

## 2016-10-22 DIAGNOSIS — Z0000 Encounter for general adult medical examination without abnormal findings: Secondary | ICD-10-CM

## 2016-10-22 LAB — MICROSCOPIC EXAMINATION: Bacteria, UA: NONE SEEN

## 2016-10-22 LAB — UA/M W/RFLX CULTURE, ROUTINE
BILIRUBIN UA: NEGATIVE
GLUCOSE, UA: NEGATIVE
Ketones, UA: NEGATIVE
LEUKOCYTES UA: NEGATIVE
NITRITE UA: NEGATIVE
Protein, UA: NEGATIVE
RBC, UA: NEGATIVE
SPEC GRAV UA: 1.025 (ref 1.005–1.030)
Urobilinogen, Ur: 0.2 mg/dL (ref 0.2–1.0)
pH, UA: 6 (ref 5.0–7.5)

## 2016-10-22 MED ORDER — TRAZODONE HCL 50 MG PO TABS: 25.0000 mg | ORAL_TABLET | Freq: Every evening | ORAL | 3 refills | Status: DC | PRN

## 2016-10-22 NOTE — Assessment & Plan Note (Signed)
Will start trazodone and recheck 1-2 months. Call with any concerns.

## 2016-10-22 NOTE — Patient Instructions (Addendum)
Preventive Care for [place], Male The transition to life after high school as a young adult can be a stressful time with many changes. You may start seeing a primary care physician instead of a pediatrician. This is the time when your health care becomes your responsibility. Preventive care refers to lifestyle choices and visits with your health care provider that can promote health and wellness. What does preventive care include?  A yearly physical exam. This is also called an annual wellness visit.  Dental exams once or twice a year.  Routine eye exams. Ask your health care provider how often you should have your eyes checked.  Personal lifestyle choices, including: ? Daily care of your teeth and gums. ? Regular physical activity. ? Eating a healthy diet. ? Avoiding tobacco and drug use. ? Avoiding or limiting alcohol use. ? Practicing safe sex. What happens during an annual wellness visit? Preventive care starts with a yearly visit to your primary care physician. The services and screenings done by your health care provider during your annual wellness visit will depend on your overall health, lifestyle risk factors, and family history of disease. Counseling Your health care provider may ask you questions about:  Past medical problems and your family's medical history.  Medicines or supplements that you take.  Health insurance and access to health care.  Alcohol, tobacco, and drug use, including use of any bodybuilding drugs (anabolic steroids).  Your safety at home, work, or school.  Access to firearms.  Emotional well-being and how you cope with stress.  Relationship well-being.  Diet, exercise, and sleep habits.  Your sexual health and activity.  Screening You may have the following tests or measurements:  Height, weight, and BMI.  Blood pressure.  Lipid and cholesterol levels.  Tuberculosis skin test.  Skin exam.  Vision and hearing tests.  Genital  exam to check for testicular cancer or hernias.  Screening test for hepatitis.  Screening tests for STDs (sexually transmitted diseases), if you are at risk.  Vaccines Your health care provider may recommend certain vaccines, such as:  Influenza vaccine. This is recommended every year.  Tetanus, diphtheria, and acellular pertussis (Tdap, Td) vaccine. You may need a Td booster every 10 years.  Varicella vaccine. You may need this if you have not been vaccinated.  HPV vaccine. If you are 8 or younger, you may need three doses over 6 months.  Measles, mumps, and rubella (MMR) vaccine. You may need at least one dose of MMR. You may also need a second dose.  Pneumococcal 13-valent conjugate (PCV13) vaccine. You may need this if you have certain conditions and have not been vaccinated.  Pneumococcal polysaccharide (PPSV23) vaccine. You may need one or two doses if you smoke cigarettes or if you have certain conditions.  Meningococcal vaccine. One dose is recommended if you are age 83-21 years and a first-year college student living in a residence hall, or if you have one of several medical conditions. You may also need additional booster doses.  Hepatitis A vaccine. You may need this if you have certain conditions or if you travel or work in places where you may be exposed to hepatitis A.  Hepatitis B vaccine. You may need this if you have certain conditions or if you travel or work in places where you may be exposed to hepatitis B.  Haemophilus influenzae type b (Hib) vaccine. You may need this if you have certain risk factors.  Talk to your health care provider about which  screenings and vaccines you need and how often you need them. What steps can I take to develop healthy behaviors?  Have regular preventive health care visits with your primary care physician and dentist.  Eat a healthy diet.  Drink enough fluid to keep your urine clear or pale yellow.  Stay active. Exercise at  least 30 minutes 5 or more days of the week.  Use alcohol responsibly.  Maintain a healthy weight.  Do not use any products that contain nicotine, such as cigarettes, chewing tobacco, and e-cigarettes. If you need help quitting, ask your health care provider.  Do not use drugs.  Practice safe sex. This includes using condoms to prevent STDs or an unwanted pregnancy.  Find healthy ways to manage stress. How can I protect myself from injury? Injuries from violence or accidents are the leading cause of death among young adults and can often be prevented. Take these steps to help protect yourself:  Always wear your seat belt while driving or riding in a vehicle.  Do not drive if you have been drinking alcohol. Do not ride with someone who has been drinking.  Do not drive when you are tired or distracted. Do not text while driving.  Wear a helmet and other protective equipment during sports activities.  If you have firearms in your house, make sure you follow all gun safety procedures.  Seek help if you have been bullied, physically abused, or sexually abused.  Avoid fighting.  Use the Internet responsibly to avoid dangers such as online bullying. What can I do to cope with stress? Young adults may face many new challenges that can be stressful, such as finding a job, going to college, moving away from home, managing money, being in a relationship, getting married, and having children. To manage stress:  Avoid known stressful situations when you can.  Exercise regularly.  Find a stress-reducing activity that works best for you. Examples include meditation, yoga, listening to music, or reading.  Spend time in nature.  Keep a journal to write about your stress and how you respond.  Talk to your health care provider about stress. He or she may suggest counseling.  Spend time with supportive friends or family.  Do not cope with stress by:  Drinking alcohol or using  drugs.  Smoking cigarettes.  Eating. Where can I get more information? Learn more about preventive care and healthy habits from:  U.S. Preventive Services Task Force: www.uspreventiveservicestaskforce.org/Tools/ConsumerInfo/Index/information-for-consumers  National Adolescent and Young Adult Health Information Center: http://nahic.ucsf.edu/resource-center/  American Academy of Pediatrics Bright Futures: https://brightfutures.aap.org  Society for Adolescent Health and Medicine: www.adolescenthealth.org/Resources/Clinical-Care-Resources/Mental-Health/Mental-Health-Resources-For-Adolesc.aspx  HealthCare.gov: www.healthcare.gov/young-adults/coverage/ This information is not intended to replace advice given to you by your health care provider. Make sure you discuss any questions you have with your health care provider. Document Released: 05/26/2015 Document Revised: 06/16/2015 Document Reviewed: 05/26/2015 Elsevier Interactive Patient Education  2017 [employer]  

## 2016-10-22 NOTE — Progress Notes (Signed)
BP 118/74 (BP Location: Left Arm, Patient Position: Sitting, Cuff Size: Normal)   Pulse (!) 53   Temp (!) 97.2 F (36.2 C)   Ht 5' 9.4" (1.763 m)   Wt 156 lb 3 oz (70.8 kg)   SpO2 100%   BMI 22.80 kg/m    Subjective:    Patient ID: Colton Simmons, male    DOB: 1995-11-01, 21 y.o.   MRN: [id number]  HPI: Colton Simmons is a 21 y.o. male presenting on 10/22/2016 for comprehensive medical examination. Current medical complaints include:  INSOMNIA Duration: year Satisfied with sleep quality: no Difficulty falling asleep: no Difficulty staying asleep: yes Waking a few hours after sleep onset: yes Early morning awakenings: yes Daytime hypersomnolence: no Wakes feeling refreshed: no Good sleep hygiene: yes Apnea: no Snoring: no Depressed/anxious mood: yes Recent stress: yes Restless legs/nocturnal leg cramps: no Chronic pain/arthritis: no History of sleep study: no Treatments attempted: lavender, valarian root   He currently lives with: parents Interim Problems from his last visit: no  Depression Screen done today and results listed below:  Depression screen [facility] 2/9 10/22/2016 10/21/2015  Decreased Interest 0 1  Down, Depressed, Hopeless 0 2  PHQ - 2 Score 0 3  Altered sleeping 3 2  Tired, decreased energy 3 3  Change in appetite 0 1  Feeling bad or failure about yourself  1 3  Trouble concentrating 1 1  Moving slowly or fidgety/restless 3 1  Suicidal thoughts 0 1  PHQ-9 Score 11 15    Past Medical History:  History reviewed. No pertinent past medical history.  Surgical History:  Past Surgical History:  Procedure Laterality Date  . PROSTATE SURGERY  2014    Medications:  No current outpatient prescriptions on file prior to visit.   No current facility-administered medications on file prior to visit.     Allergies:  No Known Allergies  Social History:  Social History   Social History  . Marital status: Single    Spouse name: N/A  . Number of children:  N/A  . Years of education: N/A   Occupational History  . Not on file.   Social History Main Topics  . Smoking status: Never Smoker  . Smokeless tobacco: Never Used  . Alcohol use Yes     Comment: socially  . Drug use: No  . Sexual activity: Yes   Other Topics Concern  . Not on file   Social History Narrative  . No narrative on file   History  Smoking Status  . Never Smoker  Smokeless Tobacco  . Never Used   History  Alcohol Use  . Yes    Comment: socially    Family History:  Family History  Problem Relation Age of Onset  . Cancer Father   . Menstrual problems Sister   . Cancer Paternal Aunt     Past medical history, surgical history, medications, allergies, family history and social history reviewed with patient today and changes made to appropriate areas of the chart.   Review of Systems  Constitutional: Negative.   HENT: Negative.   Eyes: Positive for blurred vision. Negative for double vision, photophobia, pain, discharge and redness.  Respiratory: Negative.   Cardiovascular: Negative.   Gastrointestinal: Negative.   Genitourinary: Negative.   Musculoskeletal: Negative.   Skin: Negative.   Neurological: Negative.   Endo/Heme/Allergies: Positive for polydipsia. Negative for environmental allergies. Does not bruise/bleed easily.  Psychiatric/Behavioral: Positive for depression. Negative for hallucinations, memory loss, substance abuse  and suicidal ideas. The patient is nervous/anxious and has insomnia.     All other ROS negative except what is listed above and in the HPI.      Objective:    BP 118/74 (BP Location: Left Arm, Patient Position: Sitting, Cuff Size: Normal)   Pulse (!) 53   Temp (!) 97.2 F (36.2 C)   Ht 5' 9.4" (1.763 m)   Wt 156 lb 3 oz (70.8 kg)   SpO2 100%   BMI 22.80 kg/m   Wt Readings from Last 3 Encounters:  10/22/16 156 lb 3 oz (70.8 kg)  07/24/16 148 lb 8 oz (67.4 kg)  07/04/16 159 lb (72.1 kg)    Physical Exam    Constitutional: He is oriented to person, place, and time. He appears well-developed and well-nourished. No distress.  HENT:  Head: Normocephalic and atraumatic.  Right Ear: Hearing, tympanic membrane, external ear and ear canal normal.  Left Ear: Hearing, tympanic membrane, external ear and ear canal normal.  Nose: Nose normal.  Mouth/Throat: Uvula is midline, oropharynx is clear and moist and mucous membranes are normal. No oropharyngeal exudate.  Eyes: Pupils are equal, round, and reactive to light. Conjunctivae, EOM and lids are normal. Right eye exhibits no discharge. Left eye exhibits no discharge. No scleral icterus.  Neck: Normal range of motion. Neck supple. No JVD present. No tracheal deviation present. No thyromegaly present.  Cardiovascular: Normal rate, regular rhythm, normal heart sounds and intact distal pulses.  Exam reveals no gallop and no friction rub.   No murmur heard. Pulmonary/Chest: Effort normal and breath sounds normal. No stridor. No respiratory distress. He has no wheezes. He has no rales. He exhibits no tenderness.  Abdominal: Soft. Bowel sounds are normal. He exhibits no distension and no mass. There is no tenderness. There is no rebound and no guarding. Hernia confirmed negative in the right inguinal area and confirmed negative in the left inguinal area.  Genitourinary: Testes normal and penis normal. Right testis shows no mass, no swelling and no tenderness. Right testis is descended. Cremasteric reflex is not absent on the right side. Left testis shows no mass, no swelling and no tenderness. Left testis is descended. Cremasteric reflex is not absent on the left side. Circumcised. No penile tenderness.  Musculoskeletal: Normal range of motion. He exhibits no edema, tenderness or deformity.  Lymphadenopathy:    He has no cervical adenopathy.  Neurological: He is alert and oriented to person, place, and time. He has normal reflexes. He displays normal reflexes. No  cranial nerve deficit. He exhibits normal muscle tone. Coordination normal.  Skin: Skin is warm, dry and intact. No rash noted. He is not diaphoretic. No erythema. No pallor.  Psychiatric: He has a normal mood and affect. His speech is normal and behavior is normal. Judgment and thought content normal. Cognition and memory are normal.  Nursing note and vitals reviewed.   Results for orders placed or performed in visit on 07/24/16  CBC with Differential/Platelet  Result Value Ref Range   WBC 3.2 (L) 3.8 - 10.6 K/uL   RBC 5.49 4.40 - 5.90 MIL/uL   Hemoglobin 15.0 13.0 - 18.0 g/dL   HCT 16.1 09.6 - 04.5 %   MCV 82.2 80.0 - 100.0 fL   MCH 27.3 26.0 - 34.0 pg   MCHC 33.2 32.0 - 36.0 g/dL   RDW 40.9 81.1 - 91.4 %   Platelets 204 150 - 440 K/uL   Neutrophils Relative % 47 %   Neutro  Abs 1.5 1.4 - 6.5 K/uL   Lymphocytes Relative 40 %   Lymphs Abs 1.3 1.0 - 3.6 K/uL   Monocytes Relative 8 %   Monocytes Absolute 0.3 0.2 - 1.0 K/uL   Eosinophils Relative 1 %   Eosinophils Absolute 0.0 0 - 0.7 K/uL   Basophils Relative 4 %   Basophils Absolute 0.1 0 - 0.1 K/uL  Folate  Result Value Ref Range   Folate 29.0 >5.9 ng/mL  Vitamin B12  Result Value Ref Range   Vitamin B-12 746 180 - 914 pg/mL  Pathologist smear review  Result Value Ref Range   Path Review      Peripheral smear review is notable for leukopenia. WBC and RBC morphology appear normal. Dr. Excell Seltzer.      Assessment & Plan:   Problem List Items Addressed This Visit      Other   Insomnia    Will start trazodone and recheck 1-2 months. Call with any concerns.        Other Visit Diagnoses    Routine general medical examination at a health care facility    -  Primary   Tdap next visit. Labs checked today. Continue diet and exercise. Call with any concerns.    Relevant Orders   CBC with Differential/Platelet   Comprehensive metabolic panel   Lipid Panel w/o Chol/HDL Ratio   TSH   UA/M w/rflx Culture, Routine   Routine  screening for STI (sexually transmitted infection)       Labs drawn today. Await results.    Relevant Orders   HIV antibody   Hepatitis, Acute   HSV(herpes simplex vrs) 1+2 ab-IgG   RPR   GC/Chlamydia Probe Amp   Reactive depression       Thinks that it's due to sleep issues- will treat those and recheck 1-2 months.    Relevant Medications   traZODone (DESYREL) 50 MG tablet       LABORATORY TESTING:  Health maintenance labs ordered today as discussed above.   IMMUNIZATIONS:   - Tdap: Tetanus vaccination status reviewed: Tdap vaccination indicated and to be given next visit. - Influenza: Refused - Pneumovax: Not applicable  PATIENT COUNSELING:    Sexuality: Discussed sexually transmitted diseases, partner selection, use of condoms, avoidance of unintended pregnancy  and contraceptive alternatives.   Advised to avoid cigarette smoking.  I discussed with the patient that most people either abstain from alcohol or drink within safe limits (<=14/week and <=4 drinks/occasion for males, <=7/weeks and <= 3 drinks/occasion for females) and that the risk for alcohol disorders and other health effects rises proportionally with the number of drinks per week and how often a drinker exceeds daily limits.  Discussed cessation/primary prevention of drug use and availability of treatment for abuse.   Diet: Encouraged to adjust caloric intake to maintain  or achieve ideal body weight, to reduce intake of dietary saturated fat and total fat, to limit sodium intake by avoiding high sodium foods and not adding table salt, and to maintain adequate dietary potassium and calcium preferably from fresh fruits, vegetables, and low-fat dairy products.    stressed the importance of regular exercise  Injury prevention: Discussed safety belts, safety helmets, smoke detector, smoking near bedding or upholstery.   Dental health: Discussed importance of regular tooth brushing, flossing, and dental visits.    Follow up plan: NEXT PREVENTATIVE PHYSICAL DUE IN 1 YEAR. Return 1-2 months, for check in on sleep and tdap.

## 2016-10-23 LAB — COMPREHENSIVE METABOLIC PANEL
ALBUMIN: 4.5 g/dL (ref 3.5–5.5)
ALT: 10 IU/L (ref 0–44)
AST: 15 IU/L (ref 0–40)
Albumin/Globulin Ratio: 2 (ref 1.2–2.2)
Alkaline Phosphatase: 80 IU/L (ref 39–117)
BILIRUBIN TOTAL: 1.6 mg/dL — AB (ref 0.0–1.2)
BUN / CREAT RATIO: 13 (ref 9–20)
BUN: 13 mg/dL (ref 6–20)
CHLORIDE: 105 mmol/L (ref 96–106)
CO2: 28 mmol/L (ref 20–29)
Calcium: 9.2 mg/dL (ref 8.7–10.2)
Creatinine, Ser: 1.03 mg/dL (ref 0.76–1.27)
GFR calc non Af Amer: 103 mL/min/{1.73_m2} (ref >59)
GFR, EST AFRICAN AMERICAN: 119 mL/min/{1.73_m2} (ref >59)
GLOBULIN, TOTAL: 2.3 g/dL (ref 1.5–4.5)
Glucose: 90 mg/dL (ref 65–99)
Potassium: 4.4 mmol/L (ref 3.5–5.2)
SODIUM: 143 mmol/L (ref 134–144)
Total Protein: 6.8 g/dL (ref 6.0–8.5)

## 2016-10-23 LAB — CBC WITH DIFFERENTIAL/PLATELET
BASOS ABS: 0 10*3/uL (ref 0.0–0.2)
Basos: 0 %
EOS (ABSOLUTE): 0.1 10*3/uL (ref 0.0–0.4)
EOS: 3 %
HEMATOCRIT: 42.7 % (ref 37.5–51.0)
HEMOGLOBIN: 14.3 g/dL (ref 13.0–17.7)
Immature Grans (Abs): 0 10*3/uL (ref 0.0–0.1)
Immature Granulocytes: 0 %
LYMPHS ABS: 1.7 10*3/uL (ref 0.7–3.1)
Lymphs: 50 %
MCH: 27.1 pg (ref 26.6–33.0)
MCHC: 33.5 g/dL (ref 31.5–35.7)
MCV: 81 fL (ref 79–97)
MONOCYTES: 11 %
MONOS ABS: 0.4 10*3/uL (ref 0.1–0.9)
NEUTROS ABS: 1.3 10*3/uL — AB (ref 1.4–7.0)
Neutrophils: 36 %
Platelets: 202 10*3/uL (ref 150–379)
RBC: 5.27 x10E6/uL (ref 4.14–5.80)
RDW: 12.6 % (ref 12.3–15.4)
WBC: 3.5 10*3/uL (ref 3.4–10.8)

## 2016-10-23 LAB — LIPID PANEL W/O CHOL/HDL RATIO
Cholesterol, Total: 128 mg/dL (ref 100–199)
HDL: 48 mg/dL (ref >39)
LDL CALC: 64 mg/dL (ref 0–99)
Triglycerides: 79 mg/dL (ref 0–149)
VLDL CHOLESTEROL CAL: 16 mg/dL (ref 5–40)

## 2016-10-23 LAB — HSV(HERPES SIMPLEX VRS) I + II AB-IGG
HSV 1 Glycoprotein G Ab, IgG: <0.91 index (ref 0.00–0.90)
HSV 2 IgG, Type Spec: <0.91 index (ref 0.00–0.90)

## 2016-10-23 LAB — HEPATITIS PANEL, ACUTE
HEP A IGM: NEGATIVE
Hep B C IgM: NEGATIVE
Hepatitis B Surface Ag: NEGATIVE

## 2016-10-23 LAB — TSH: TSH: 1.51 u[IU]/mL (ref 0.450–4.500)

## 2016-10-23 LAB — RPR: RPR Ser Ql: NONREACTIVE

## 2016-10-23 LAB — HIV ANTIBODY (ROUTINE TESTING W REFLEX): HIV SCREEN 4TH GENERATION: NONREACTIVE

## 2016-10-24 ENCOUNTER — Telehealth: Payer: Self-pay | Admitting: Family Medicine

## 2016-10-24 LAB — GC/CHLAMYDIA PROBE AMP
Chlamydia trachomatis, NAA: NEGATIVE
Neisseria gonorrhoeae by PCR: NEGATIVE

## 2016-10-24 NOTE — Telephone Encounter (Signed)
Please let him know that all his blood work came back normal. Thanks!

## 2016-10-24 NOTE — Telephone Encounter (Signed)
Patient notified

## 2016-11-27 ENCOUNTER — Ambulatory Visit: Payer: BLUE CROSS/BLUE SHIELD | Admitting: Family Medicine

## 2016-11-29 ENCOUNTER — Ambulatory Visit: Payer: BLUE CROSS/BLUE SHIELD | Admitting: Family Medicine

## 2016-12-19 ENCOUNTER — Telehealth: Payer: Self-pay | Admitting: Oncology

## 2016-12-19 NOTE — Telephone Encounter (Signed)
Lab/MD appt rschd, per Provider on PAL. Appt conf with patient. MF °

## 2017-02-22 ENCOUNTER — Ambulatory Visit: Payer: BLUE CROSS/BLUE SHIELD | Admitting: Oncology

## 2017-02-22 ENCOUNTER — Other Ambulatory Visit: Payer: BLUE CROSS/BLUE SHIELD

## 2017-02-28 ENCOUNTER — Other Ambulatory Visit: Payer: Self-pay | Admitting: *Deleted

## 2017-02-28 DIAGNOSIS — D709 Neutropenia, unspecified: Secondary | ICD-10-CM

## 2017-03-01 ENCOUNTER — Inpatient Hospital Stay: Payer: BLUE CROSS/BLUE SHIELD

## 2017-03-01 ENCOUNTER — Inpatient Hospital Stay: Payer: BLUE CROSS/BLUE SHIELD | Admitting: Oncology

## 2017-03-14 ENCOUNTER — Encounter: Payer: Self-pay | Admitting: *Deleted

## 2017-03-14 ENCOUNTER — Inpatient Hospital Stay: Payer: BLUE CROSS/BLUE SHIELD | Attending: Oncology

## 2017-03-14 ENCOUNTER — Inpatient Hospital Stay: Payer: BLUE CROSS/BLUE SHIELD | Admitting: Oncology

## 2017-03-18 ENCOUNTER — Telehealth: Payer: Self-pay | Admitting: *Deleted

## 2017-03-18 NOTE — Telephone Encounter (Signed)
Patient has missed 3 appts and sent certified letter to close his chart to our services. Put it in mail today

## 2017-04-05 ENCOUNTER — Emergency Department
Admission: EM | Admit: 2017-04-05 | Discharge: 2017-04-06 | Disposition: A | Discharge status: Home / Self Care | Payer: BLUE CROSS/BLUE SHIELD | Attending: Emergency Medicine | Admitting: Emergency Medicine

## 2017-04-05 DIAGNOSIS — T407X1A Poisoning by cannabis (derivatives), accidental (unintentional), initial encounter: Secondary | ICD-10-CM | POA: Insufficient documentation

## 2017-04-05 DIAGNOSIS — F600 Paranoid personality disorder: Secondary | ICD-10-CM | POA: Insufficient documentation

## 2017-04-05 DIAGNOSIS — F12950 Cannabis use, unspecified with psychotic disorder with delusions: Secondary | ICD-10-CM

## 2017-04-05 DIAGNOSIS — R451 Restlessness and agitation: Secondary | ICD-10-CM | POA: Diagnosis present

## 2017-04-05 DIAGNOSIS — F22 Delusional disorders: Secondary | ICD-10-CM

## 2017-04-05 DIAGNOSIS — T50901A Poisoning by unspecified drugs, medicaments and biological substances, accidental (unintentional), initial encounter: Secondary | ICD-10-CM

## 2017-04-05 NOTE — ED Triage Notes (Signed)
Pt arrives to ed after smoking possible synthetic marijuana. Pt arrives to ed yelling. Pt repeatedly states "i'm not crazy". resps unlabored. Pt up out of bed several times charging at staff, police to bedside.

## 2017-04-05 NOTE — ED Provider Notes (Signed)
[facility] Emergency Department Provider Note  ____________________________________________   First MD Initiated Contact with Patient 04/05/17 2359     (approximate)  I have reviewed the triage vital signs and the nursing notes.   HISTORY  Chief Complaint Agitation  Level 5 exemption history limited by the patient's clinical condition  HPI Colton Simmons is a 22 y.o. male who comes to the emergency department by EMS with erratic behavior.  According to report the patient may have smoked synthetic marijuana with friends earlier this afternoon then at some point got into a car and attempted to drive home.  Apparently the patient's mother called 911.  The patient has been agitated and combative in route.  He continues to scream "I am not crazy I am not crazy".  No past medical history on file.  Patient Active Problem List   Diagnosis Date Noted  . Insomnia 10/22/2016  . Leukopenia 11/05/2014    Past Surgical History:  Procedure Laterality Date  . PROSTATE SURGERY  2014    Prior to Admission medications   Not on File    Allergies Patient has no known allergies.  Family History  Problem Relation Age of Onset  . Cancer Father   . Menstrual problems Sister   . Cancer Paternal Aunt     Social History Social History   Tobacco Use  . Smoking status: Never Smoker  . Smokeless tobacco: Never Used  Substance Use Topics  . Alcohol use: Yes    Comment: socially  . Drug use: No    Review of Systems Level 5 exemption history limited by the patient's clinical condition ____________________________________________   PHYSICAL EXAM:  VITAL SIGNS: ED Triage Vitals  Enc Vitals Group     BP      Pulse      Resp      Temp      Temp src      SpO2      Weight      Height      Head Circumference      Peak Flow      Pain Score      Pain Loc      Pain Edu?      Excl. in GC?     Constitutional: Appears paranoid diaphoretic confused  responding to internal stimuli Eyes: PERRL EOMI. dilated and brisk Head: Atraumatic. Nose: No congestion/rhinnorhea. Mouth/Throat: No trismus Neck: No stridor.   Cardiovascular: Tachycardic rate, regular rhythm. Grossly normal heart sounds.  Good peripheral circulation. Respiratory: Increased respiratory effort.  No retractions. Lungs CTAB and moving good air Gastrointestinal: Soft nontender Musculoskeletal: No lower extremity edema   Neurologic:  No gross focal neurologic deficits are appreciated. Skin: Diaphoretic Psychiatric: Paranoid and psychotic   ____________________________________________   DIFFERENTIAL includes but not limited to  Synthetic cannabis intoxication, rhabdomyolysis, cocaine intoxication, methamphetamine intoxication ____________________________________________   LABS (all labs ordered are listed, but only abnormal results are displayed)  Labs Reviewed  COMPREHENSIVE METABOLIC PANEL - Abnormal; Notable for the following components:      Result Value   Glucose, Bld 106 (*)    Calcium 8.6 (*)    ALT 16 (*)    Total Bilirubin 1.5 (*)    All other components within normal limits  CBC WITH DIFFERENTIAL/PLATELET - Abnormal; Notable for the following components:   Lymphs Abs 0.8 (*)    All other components within normal limits  ETHANOL  CK    Lab work reviewed by me  with no acute disease __________________________________________  EKG  ED ECG REPORT I, Merrily Brittle, the attending physician, personally viewed and interpreted this ECG.  Date: 04/06/2017 EKG Time:  Rate: 105 Rhythm: Sinus tachycardia QRS Axis: normal Intervals: normal ST/T Wave abnormalities: normal Narrative Interpretation: no evidence of acute ischemia  ____________________________________________  RADIOLOGY   ____________________________________________   PROCEDURES  Procedure(s) performed: no  .Critical Care Performed by: Merrily Brittle, MD Authorized by:  Merrily Brittle, MD   Critical care provider statement:    Critical care time (minutes):  35   Critical care time was exclusive of:  Separately billable procedures and treating other patients   Critical care was necessary to treat or prevent imminent or life-threatening deterioration of the following conditions:  Toxidrome   Critical care was time spent personally by me on the following activities:  Development of treatment plan with patient or surrogate, discussions with consultants, evaluation of patient's response to treatment, examination of patient, obtaining history from patient or surrogate, ordering and performing treatments and interventions, ordering and review of laboratory studies, ordering and review of radiographic studies, pulse oximetry, re-evaluation of patient's condition and review of old charts    Critical Care performed: Yes  Observation: no ____________________________________________   INITIAL IMPRESSION / ASSESSMENT AND PLAN / ED COURSE  Pertinent labs & imaging results that were available during my care of the patient were reviewed by me and considered in my medical decision making (see chart for details).  The patient arrives tachycardic, diaphoretic, paranoid.  He suddenly lurched off the gurney and made aggressive movements towards myself threatening to punch me.  At this point decision was made to give IV haloperidol, lorazepam, and diphenhydramine for the patient's own safety to facilitate a medical workup searching for an organic cause of his behavior.  ----------------------------------------- 5:55 AM on 04/06/2017 -----------------------------------------  The patient has been resting comfortably and his vital signs have normalized.  He is still quite sleepy but is able to wake up and expressed regret for the actions of the night.  His mother is present.  He is still too somnolent from the sedation to go home but do anticipate discharge shortly.       ____________________________________________   FINAL CLINICAL IMPRESSION(S) / ED DIAGNOSES  Final diagnoses:  Accidental drug overdose, initial encounter  Agitation  Paranoid behavior (HCC)  Cannabis delusional disorder (HCC)      NEW MEDICATIONS STARTED DURING THIS VISIT:  New Prescriptions   No medications on file     Note:  This document was prepared using Dragon voice recognition software and may include unintentional dictation errors.     Merrily Brittle, MD 04/06/17 [phone]

## 2017-04-06 ENCOUNTER — Other Ambulatory Visit: Payer: Self-pay

## 2017-04-06 LAB — CBC WITH DIFFERENTIAL/PLATELET
BASOS PCT: 0 %
Basophils Absolute: 0 10*3/uL (ref 0–0.1)
EOS ABS: 0 10*3/uL (ref 0–0.7)
Eosinophils Relative: 0 %
HCT: 43.4 % (ref 40.0–52.0)
HEMOGLOBIN: 14.3 g/dL (ref 13.0–18.0)
Lymphocytes Relative: 11 %
Lymphs Abs: 0.8 10*3/uL — ABNORMAL LOW (ref 1.0–3.6)
MCH: 27.3 pg (ref 26.0–34.0)
MCHC: 32.8 g/dL (ref 32.0–36.0)
MCV: 83.1 fL (ref 80.0–100.0)
MONOS PCT: 11 %
Monocytes Absolute: 0.8 10*3/uL (ref 0.2–1.0)
NEUTROS PCT: 78 %
Neutro Abs: 5.7 10*3/uL (ref 1.4–6.5)
Platelets: 175 10*3/uL (ref 150–440)
RBC: 5.22 MIL/uL (ref 4.40–5.90)
RDW: 12.4 % (ref 11.5–14.5)
WBC: 7.4 10*3/uL (ref 3.8–10.6)

## 2017-04-06 LAB — COMPREHENSIVE METABOLIC PANEL
ALK PHOS: 65 U/L (ref 38–126)
ALT: 16 U/L — ABNORMAL LOW (ref 17–63)
ANION GAP: 6 (ref 5–15)
AST: 20 U/L (ref 15–41)
Albumin: 4.1 g/dL (ref 3.5–5.0)
BILIRUBIN TOTAL: 1.5 mg/dL — AB (ref 0.3–1.2)
BUN: 16 mg/dL (ref 6–20)
CALCIUM: 8.6 mg/dL — AB (ref 8.9–10.3)
CO2: 26 mmol/L (ref 22–32)
Chloride: 106 mmol/L (ref 101–111)
Creatinine, Ser: 1.01 mg/dL (ref 0.61–1.24)
GFR calc Af Amer: >60 mL/min (ref >60)
GLUCOSE: 106 mg/dL — AB (ref 65–99)
Potassium: 3.9 mmol/L (ref 3.5–5.1)
Sodium: 138 mmol/L (ref 135–145)
TOTAL PROTEIN: 6.9 g/dL (ref 6.5–8.1)

## 2017-04-06 LAB — ETHANOL: Alcohol, Ethyl (B): <10 mg/dL (ref <10)

## 2017-04-06 LAB — CK: CK TOTAL: 225 U/L (ref 49–397)

## 2017-04-06 MED ORDER — HALOPERIDOL LACTATE 5 MG/ML IJ SOLN
5.0000 mg | Freq: Once | INTRAMUSCULAR | Status: AC
  Administered 2017-04-06: 5 mg via INTRAVENOUS

## 2017-04-06 MED ORDER — LORAZEPAM 2 MG/ML IJ SOLN
2.0000 mg | Freq: Once | INTRAMUSCULAR | Status: AC
  Administered 2017-04-06: 2 mg via INTRAVENOUS

## 2017-04-06 MED ORDER — SODIUM CHLORIDE 0.9 % IV BOLUS (SEPSIS)
1000.0000 mL | Freq: Once | INTRAVENOUS | Status: AC
  Administered 2017-04-06: 1000 mL via INTRAVENOUS

## 2017-04-06 MED ORDER — DIPHENHYDRAMINE HCL 50 MG/ML IJ SOLN
50.0000 mg | Freq: Once | INTRAMUSCULAR | Status: AC
  Administered 2017-04-06: 50 mg via INTRAVENOUS

## 2017-04-06 NOTE — ED Notes (Signed)
Attempt to get pt up and ambulate for discharge. Pt is very sleepy and unable to ambulate by self safely.

## 2017-04-06 NOTE — ED Notes (Addendum)
Lab states did not receive initial blood samples. Will recollect. Pt sleeping. resps unlabored.

## 2017-04-06 NOTE — ED Notes (Signed)
Pt. Oriented x4. Ambulated w/o assistance.

## 2017-04-06 NOTE — ED Notes (Signed)
Pt continues to sleep.

## 2017-04-06 NOTE — ED Notes (Signed)
Pt sleeping. 

## 2017-04-06 NOTE — ED Notes (Signed)
Pt continues to sleep. resps unlabored.  

## 2017-04-06 NOTE — ED Notes (Addendum)
Pt now sleeping, 2lpm iv [place] oxygen infusing while pt sleeping. Side rails up, call bell at right side. Pt has cell phone.

## 2017-04-06 NOTE — Discharge Instructions (Signed)
Do not ever smoke synthetic marijuana again it is extremely dangerous for you.  Please follow-up with your primary care physician as needed and return to the emergency department for any concerns.  It was a pleasure to take care of you today, and thank you for coming to our emergency department.  If you have any questions or concerns before leaving please ask the nurse to grab me and I'm more than happy to go through your aftercare instructions again.  If you were prescribed any opioid pain medication today such as Norco, Vicodin, Percocet, morphine, hydrocodone, or oxycodone please make sure you do not drive when you are taking this medication as it can alter your ability to drive safely.  If you have any concerns once you are home that you are not improving or are in fact getting worse before you can make it to your follow-up appointment, please do not hesitate to call 911 and come back for further evaluation.  Merrily BrittleNeil Jaedah Lords, MD  Results for orders placed or performed during the hospital encounter of 04/05/17  Comprehensive metabolic panel  Result Value Ref Range   Sodium 138 135 - 145 mmol/L   Potassium 3.9 3.5 - 5.1 mmol/L   Chloride 106 101 - 111 mmol/L   CO2 26 22 - 32 mmol/L   Glucose, Bld 106 (H) 65 - 99 mg/dL   BUN 16 6 - 20 mg/dL   Creatinine, Ser 4.091.01 0.61 - 1.24 mg/dL   Calcium 8.6 (L) 8.9 - 10.3 mg/dL   Total Protein 6.9 6.5 - 8.1 g/dL   Albumin 4.1 3.5 - 5.0 g/dL   AST 20 15 - 41 U/L   ALT 16 (L) 17 - 63 U/L   Alkaline Phosphatase 65 38 - 126 U/L   Total Bilirubin 1.5 (H) 0.3 - 1.2 mg/dL   GFR calc non Af Amer >60 >60 mL/min   GFR calc Af Amer >60 >60 mL/min   Anion gap 6 5 - 15  Ethanol  Result Value Ref Range   Alcohol, Ethyl (B) <10 <10 mg/dL  CBC with Differential  Result Value Ref Range   WBC 7.4 3.8 - 10.6 K/uL   RBC 5.22 4.40 - 5.90 MIL/uL   Hemoglobin 14.3 13.0 - 18.0 g/dL   HCT 81.143.4 91.440.0 - 78.252.0 %   MCV 83.1 80.0 - 100.0 fL   MCH 27.3 26.0 - 34.0 pg   MCHC 32.8 32.0 - 36.0 g/dL   RDW 95.612.4 21.311.5 - 08.614.5 %   Platelets 175 150 - 440 K/uL   Neutrophils Relative % 78 %   Neutro Abs 5.7 1.4 - 6.5 K/uL   Lymphocytes Relative 11 %   Lymphs Abs 0.8 (L) 1.0 - 3.6 K/uL   Monocytes Relative 11 %   Monocytes Absolute 0.8 0.2 - 1.0 K/uL   Eosinophils Relative 0 %   Eosinophils Absolute 0.0 0 - 0.7 K/uL   Basophils Relative 0 %   Basophils Absolute 0.0 0 - 0.1 K/uL  CK  Result Value Ref Range   Total CK 225 49 - 397 U/L

## 2017-04-06 NOTE — ED Notes (Signed)
Pt wanded by security for weapons, none found.

## 2017-04-11 ENCOUNTER — Ambulatory Visit: Payer: BLUE CROSS/BLUE SHIELD | Admitting: Family Medicine

## 2017-04-11 ENCOUNTER — Encounter: Payer: Self-pay | Admitting: Family Medicine

## 2017-04-11 VITALS — BP 125/81 | HR 63 | Temp 97.6°F | Wt 167.4 lb

## 2017-04-11 DIAGNOSIS — T50901A Poisoning by unspecified drugs, medicaments and biological substances, accidental (unintentional), initial encounter: Secondary | ICD-10-CM

## 2017-04-11 NOTE — Progress Notes (Signed)
BP 125/81 (BP Location: Left Arm, Patient Position: Sitting, Cuff Size: Normal)   Pulse 63   Temp 97.6 F (36.4 C) (Oral)   Wt 167 lb 6 oz (75.9 kg)   SpO2 100%   BMI 22.70 kg/m    Subjective:    Patient ID: Colton Simmons Fake, male    DOB: 11/28/1995, 22 y.o.   MRN: [id number]  HPI: Colton Simmons Earwood is a 22 y.o. male  Chief Complaint  Patient presents with  . Hospitalization Follow-up   ER FOLLOW UP Time since discharge: 5 days  Hospital/facility: [facility] Diagnosis: Accidental drug overdose on synthetic marijuana Procedures/tests: labs Consultants: None New medications: give ativan, benadryl and haldol in the ER, not sent home on anything Discharge instructions:  Follow up here Status: better- still feeling a little disconnected, concerned that there was something in the drugs   Relevant past medical, surgical, family and social history reviewed and updated as indicated. Interim medical history since our last visit reviewed. Allergies and medications reviewed and updated.  Review of Systems  Constitutional: Negative.   Respiratory: Negative.   Cardiovascular: Negative.   Psychiatric/Behavioral: Negative.        Feeling a little disconnected    Per HPI unless specifically indicated above     Objective:    BP 125/81 (BP Location: Left Arm, Patient Position: Sitting, Cuff Size: Normal)   Pulse 63   Temp 97.6 F (36.4 C) (Oral)   Wt 167 lb 6 oz (75.9 kg)   SpO2 100%   BMI 22.70 kg/m   Wt Readings from Last 3 Encounters:  04/11/17 167 lb 6 oz (75.9 kg)  04/06/17 170 lb (77.1 kg)  10/22/16 156 lb 3 oz (70.8 kg)    Physical Exam  Constitutional: He is oriented to person, place, and time. He appears well-developed and well-nourished. No distress.  HENT:  Head: Normocephalic and atraumatic.  Right Ear: Hearing normal.  Left Ear: Hearing normal.  Nose: Nose normal.  Eyes: Conjunctivae and lids are normal. Right eye exhibits no discharge. Left eye exhibits no discharge.  No scleral icterus.  Cardiovascular: Normal rate, regular rhythm, normal heart sounds and intact distal pulses. Exam reveals no gallop and no friction rub.  No murmur heard. Pulmonary/Chest: Effort normal and breath sounds normal. No respiratory distress. He has no wheezes. He has no rales. He exhibits no tenderness.  Musculoskeletal: Normal range of motion.  Neurological: He is alert and oriented to person, place, and time.  Skin: Skin is warm, dry and intact. No rash noted. He is not diaphoretic. No erythema. No pallor.  Psychiatric: He has a normal mood and affect. His speech is normal and behavior is normal. Judgment and thought content normal. Cognition and memory are normal.  Nursing note and vitals reviewed.   Results for orders placed or performed during the hospital encounter of 04/05/17  Comprehensive metabolic panel  Result Value Ref Range   Sodium 138 135 - 145 mmol/L   Potassium 3.9 3.5 - 5.1 mmol/L   Chloride 106 101 - 111 mmol/L   CO2 26 22 - 32 mmol/L   Glucose, Bld 106 (H) 65 - 99 mg/dL   BUN 16 6 - 20 mg/dL   Creatinine, Ser 3.321.01 0.61 - 1.24 mg/dL   Calcium 8.6 (L) 8.9 - 10.3 mg/dL   Total Protein 6.9 6.5 - 8.1 g/dL   Albumin 4.1 3.5 - 5.0 g/dL   AST 20 15 - 41 U/L   ALT 16 (L) 17 -  63 U/L   Alkaline Phosphatase 65 38 - 126 U/L   Total Bilirubin 1.5 (H) 0.3 - 1.2 mg/dL   GFR calc non Af Amer >60 >60 mL/min   GFR calc Af Amer >60 >60 mL/min   Anion gap 6 5 - 15  Ethanol  Result Value Ref Range   Alcohol, Ethyl (B) <10 <10 mg/dL  CBC with Differential  Result Value Ref Range   WBC 7.4 3.8 - 10.6 K/uL   RBC 5.22 4.40 - 5.90 MIL/uL   Hemoglobin 14.3 13.0 - 18.0 g/dL   HCT 30.8 65.7 - 84.6 %   MCV 83.1 80.0 - 100.0 fL   MCH 27.3 26.0 - 34.0 pg   MCHC 32.8 32.0 - 36.0 g/dL   RDW 96.2 95.2 - 84.1 %   Platelets 175 150 - 440 K/uL   Neutrophils Relative % 78 %   Neutro Abs 5.7 1.4 - 6.5 K/uL   Lymphocytes Relative 11 %   Lymphs Abs 0.8 (L) 1.0 - 3.6 K/uL    Monocytes Relative 11 %   Monocytes Absolute 0.8 0.2 - 1.0 K/uL   Eosinophils Relative 0 %   Eosinophils Absolute 0.0 0 - 0.7 K/uL   Basophils Relative 0 %   Basophils Absolute 0.0 0 - 0.1 K/uL  CK  Result Value Ref Range   Total CK 225 49 - 397 U/L      Assessment & Plan:   Problem List Items Addressed This Visit    None    Visit Diagnoses    Accidental overdose, initial encounter    -  Primary   Will check tox screen to see if anything else comes up- aware that it may be too late. Advised against using marijuana in the future. Call with any concerns.    Relevant Orders   ToxAssure Select,+Antidepr,UR       Follow up plan: Return if symptoms worsen or fail to improve.

## 2017-04-11 NOTE — Patient Instructions (Signed)
What You Need to Know About Marijuana Use Marijuana is a mixture of the dried leaves and flowers of the hemp plant Cannabis sativa. The plant's active ingredients (cannabinoids) change the chemistry of the brain. If you smoke or eat marijuana, you will experience changes in the way you think, feel, and behave. Many people use marijuana because it helps them relax and puts them in a pleasurable mood (marijuana high). Some people use marijuana for medical effects, such as:  Reduced nausea.  Increased appetite.  Reduced muscle spasm.  Pain relief.  Researchers are studying other possible medical uses for marijuana. How can marijuana use affect me? Many people find a marijuana high to be pleasurable and relaxing. Other people find a marijuana high to be uncomfortable or anxiety-causing. This drug can cause short-term and long-term physical and mental effects. Taking high doses of marijuana or trying to quit marijuana can also affect you. Short-term effects of marijuana use include:  Temporary relief of symptoms from a medical condition.  Changes in mood and perception (feeling high).  Increased hunger.  Increased heart rate.  Slowed movement and reaction time.  Poor memory, judgment, and problem solving ability.  Altered sense of time.  Changes to vision.  Bloodshot eyes.  Coughing.  Long-term effects of marijuana use include:  Higher risk of lung and breathing problems.  Higher risk of heart attack.  Higher risk of testicular cancer.  Mental and physical dependence (addiction).  Slowed brain development in young people. Babies whose mothers used marijuana during pregnancy have an increased risk of problems with brain development and behavior.  Temporary periods of false perceptions or beliefs (hallucinations or paranoia).  Worsening of mental illness.  Onset of new mental illness such as anxiety, depression, or suicidal thoughts.  Withdrawal symptoms when stopping  marijuana, such as sleeplessness, anxiety, cravings, and anger.  Difficulty maintaining healthy relationships.  Poor memory, and difficulty concentrating and learning. This can result in decreased intelligence and poor performance at school or work, and an increased risk of dropping out of school.  Higher risk of using other substances like alcohol and nicotine.  High doses of marijuana can cause:  Panic.  Anxiety.  Mental confusion.  Hallucinations.  Quitting marijuana after using it for a long time can cause withdrawal symptoms, such as:  Headache.  Shakiness.  Sweating.  Stomach pain.  Nausea.  Restlessness.  Irritability.  Trouble sleeping.  Decreased appetite.  What are the benefits of not using marijuana? Not using marijuana can keep you from becoming dependent on it. You can avoid the negative effects of the drug that can reduce your quality of life. You can avoid accidents caused by the slowed reaction time that is common with marijuana use. If I already use marijuana, what steps can I take to stop using it? If you are not physically or mentally dependent on marijuana, you should be able to stop using it on your own. If you cannot stop on your own, ask your health care provider for help. Treatment for marijuana addiction is similar to treatment for other addictions. It may include:  Cognitive-behavioral therapy (psychotherapy). This may include individual or group therapy.  Joining a support group.  Treating medical, behavioral, or mental health conditions that exist along with marijuana dependency.  Where can I get more information? Learn more about:  Marijuana from the [employer]: Natworking.huhttps://www.drugabuse.gov/publications/drugfacts/marijuana  Medical marijuana from the [employer]: RunningShows.co.zahttps://nccih.nih.gov/health/marijuana  Treatment options from the Substance Abuse and Mental Health  Services Administration:  https://findtreatment.http://gonzalez-rivas.net/  Recovery from marijuana dependency from Recovery.org: http://www.recovery.org/topics/marijuana-recovery  When should I seek medical care? Talk with your health care provider if:  You want to stop using marijuana but you cannot.  You have withdrawal symptoms when you try to stop using marijuana.  You are using marijuana every day.  You are using marijuana along with other drugs like cocaine or alcohol.  You have anxiety or depression.  You have hallucinations or paranoia.  Marijuana use is interfering with your relationships or your ability to function normally at school or at work.  Summary  You may become physically or mentally dependent on marijuana.  Long-term use may interfere with your ability to function normally at home, school, or work.  Marijuana addiction is treatable. This information is not intended to replace advice given to you by your health care provider. Make sure you discuss any questions you have with your health care provider. Document Released: 12/31/2014 Document Revised: 09/28/2015 Document Reviewed: 09/28/2015 Elsevier Interactive Patient Education  2018 [employer].

## 2017-04-15 ENCOUNTER — Telehealth: Payer: Self-pay | Admitting: *Deleted

## 2017-04-15 NOTE — Telephone Encounter (Signed)
I had sent certified letter to patient dicahrging him from the practice.  It was returmed to our office stating from [employer] that it was unclaimed and there is no address to forward to the patient.  Case closed unless pt would call back and work out another appt in future.

## 2017-04-16 LAB — TOXASSURE SELECT,+ANTIDEPR,UR

## 2017-04-18 ENCOUNTER — Telehealth: Payer: Self-pay | Admitting: Family Medicine

## 2017-04-18 NOTE — Telephone Encounter (Signed)
Called patient with results. Utox negative. Avoid marijuana. Call with any concerns.

## 2017-07-11 ENCOUNTER — Ambulatory Visit: Payer: BLUE CROSS/BLUE SHIELD

## 2018-10-07 ENCOUNTER — Other Ambulatory Visit: Payer: Self-pay

## 2018-10-07 DIAGNOSIS — Z20822 Contact with and (suspected) exposure to covid-19: Secondary | ICD-10-CM

## 2018-10-09 LAB — NOVEL CORONAVIRUS, NAA: SARS-CoV-2, NAA: NOT DETECTED
